# Patient Record
Sex: Male | Born: 1995 | Race: White | Hispanic: No | Marital: Single | Smoking: Never smoker
Health system: Southern US, Community
[De-identification: ages and names within clinical notes are randomized; demographics above are authoritative.]

## PROBLEM LIST (undated history)

## (undated) DIAGNOSIS — G4489 Other headache syndrome: Secondary | ICD-10-CM

## (undated) HISTORY — DX: Other headache syndrome: G44.89

## (undated) HISTORY — PX: WISDOM TOOTH EXTRACTION: SHX21

---
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
## 2015-07-07 DIAGNOSIS — K648 Other hemorrhoids: Secondary | ICD-10-CM | POA: Diagnosis not present

## 2015-07-07 DIAGNOSIS — K625 Hemorrhage of anus and rectum: Secondary | ICD-10-CM | POA: Diagnosis not present

## 2015-07-07 DIAGNOSIS — R51 Headache: Secondary | ICD-10-CM | POA: Diagnosis not present

## 2015-08-16 DIAGNOSIS — D1801 Hemangioma of skin and subcutaneous tissue: Secondary | ICD-10-CM | POA: Diagnosis not present

## 2015-08-16 DIAGNOSIS — L906 Striae atrophicae: Secondary | ICD-10-CM | POA: Diagnosis not present

## 2015-08-16 DIAGNOSIS — D224 Melanocytic nevi of scalp and neck: Secondary | ICD-10-CM | POA: Diagnosis not present

## 2015-10-06 DIAGNOSIS — D1801 Hemangioma of skin and subcutaneous tissue: Secondary | ICD-10-CM | POA: Diagnosis not present

## 2015-10-06 DIAGNOSIS — L720 Epidermal cyst: Secondary | ICD-10-CM | POA: Diagnosis not present

## 2015-10-06 DIAGNOSIS — D224 Melanocytic nevi of scalp and neck: Secondary | ICD-10-CM | POA: Diagnosis not present

## 2015-10-06 DIAGNOSIS — D2261 Melanocytic nevi of right upper limb, including shoulder: Secondary | ICD-10-CM | POA: Diagnosis not present

## 2015-10-20 DIAGNOSIS — Z139 Encounter for screening, unspecified: Secondary | ICD-10-CM | POA: Diagnosis not present

## 2015-10-20 DIAGNOSIS — K648 Other hemorrhoids: Secondary | ICD-10-CM | POA: Diagnosis not present

## 2015-10-20 DIAGNOSIS — R51 Headache: Secondary | ICD-10-CM | POA: Diagnosis not present

## 2015-10-20 DIAGNOSIS — K625 Hemorrhage of anus and rectum: Secondary | ICD-10-CM | POA: Diagnosis not present

## 2016-06-21 DIAGNOSIS — R5383 Other fatigue: Secondary | ICD-10-CM | POA: Diagnosis not present

## 2016-06-21 DIAGNOSIS — J301 Allergic rhinitis due to pollen: Secondary | ICD-10-CM | POA: Diagnosis not present

## 2016-06-26 DIAGNOSIS — R51 Headache: Secondary | ICD-10-CM | POA: Diagnosis not present

## 2016-06-26 DIAGNOSIS — D72829 Elevated white blood cell count, unspecified: Secondary | ICD-10-CM | POA: Diagnosis not present

## 2016-06-26 DIAGNOSIS — R531 Weakness: Secondary | ICD-10-CM | POA: Diagnosis not present

## 2016-07-10 DIAGNOSIS — R51 Headache: Secondary | ICD-10-CM | POA: Diagnosis not present

## 2016-07-13 DIAGNOSIS — Z0100 Encounter for examination of eyes and vision without abnormal findings: Secondary | ICD-10-CM | POA: Diagnosis not present

## 2016-07-19 ENCOUNTER — Ambulatory Visit
Admission: RE | Admit: 2016-07-19 | Discharge: 2016-07-19 | Disposition: A | Discharge status: Home / Self Care | Payer: BLUE CROSS/BLUE SHIELD | Source: Ambulatory Visit | Attending: Internal Medicine | Admitting: Internal Medicine

## 2016-07-19 ENCOUNTER — Other Ambulatory Visit: Payer: Self-pay | Admitting: Internal Medicine

## 2016-07-19 DIAGNOSIS — J029 Acute pharyngitis, unspecified: Secondary | ICD-10-CM | POA: Diagnosis not present

## 2016-07-19 DIAGNOSIS — R51 Headache: Secondary | ICD-10-CM | POA: Diagnosis not present

## 2016-07-19 DIAGNOSIS — R519 Headache, unspecified: Secondary | ICD-10-CM

## 2016-07-19 DIAGNOSIS — R05 Cough: Secondary | ICD-10-CM | POA: Diagnosis not present

## 2016-07-19 DIAGNOSIS — J322 Chronic ethmoidal sinusitis: Secondary | ICD-10-CM | POA: Diagnosis not present

## 2016-07-19 IMAGING — CT CT HEAD W/O CM
3 of 4 series · 17 of 47 positions shown, 20 images · non-contrast
Comparison: None.

CLINICAL DATA: Non intractable headache.

EXAM:
CT HEAD WITHOUT CONTRAST
TECHNIQUE: Contiguous axial images were obtained from the base of the skull
through the vertex without intravenous contrast.

[Series 32: 3d filtered head w/o · axial · non-contrast · 0.49mm/px · z∈[-13,+127]mm · 11 of 34 slices shown, 14 images]
[im 3/34  brain]
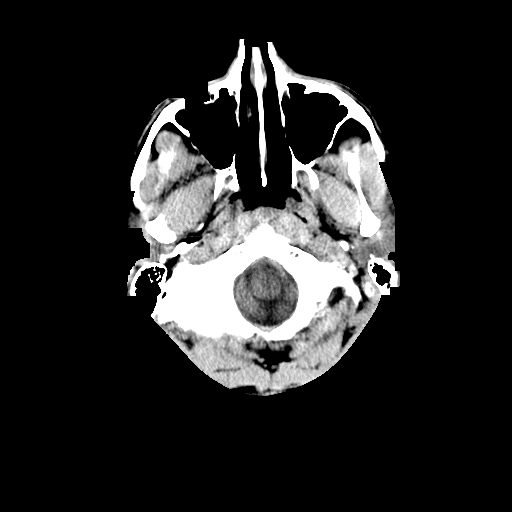
[im 3/34  bone]
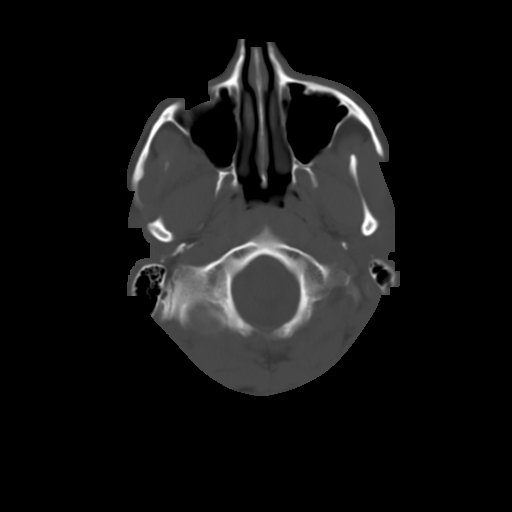
[im 5/34  brain]
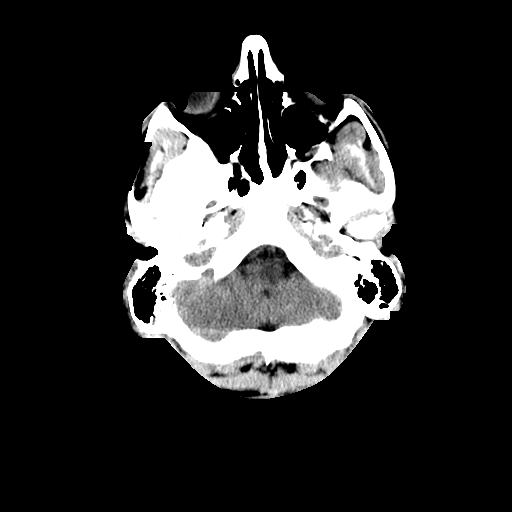
[im 8/34  brain]
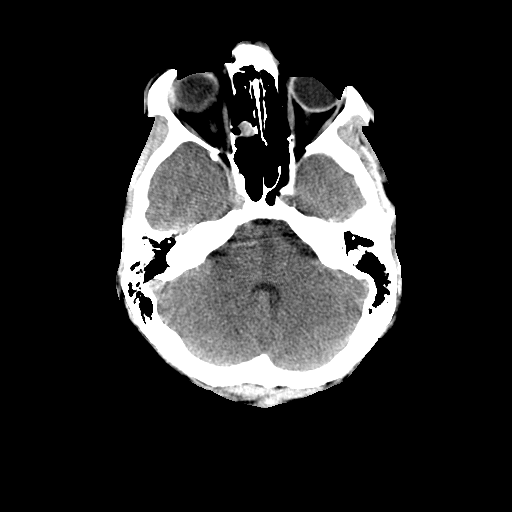
[im 12/34  brain]
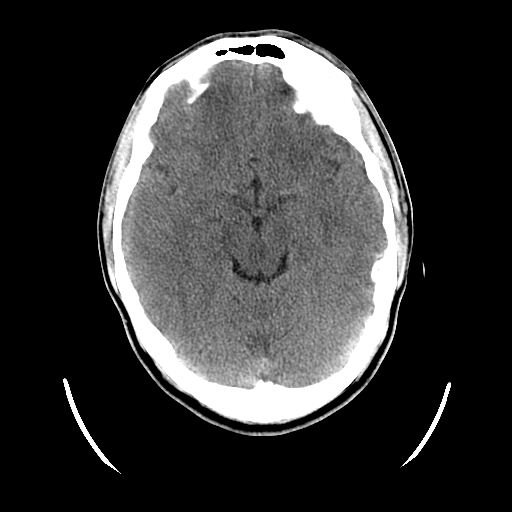
[im 15/34  brain]
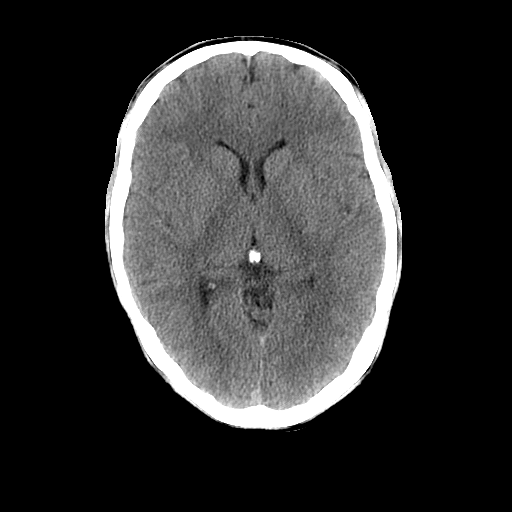
[im 15/34  bone]
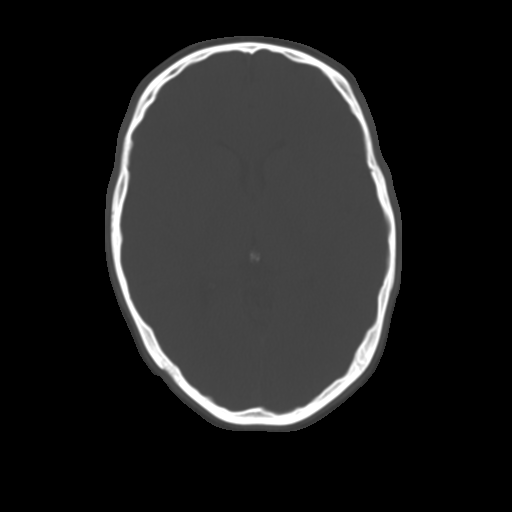
[im 17/34  brain]
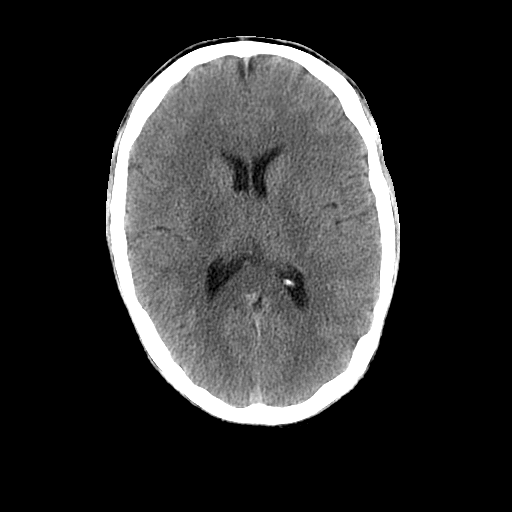
[im 19/34  brain]
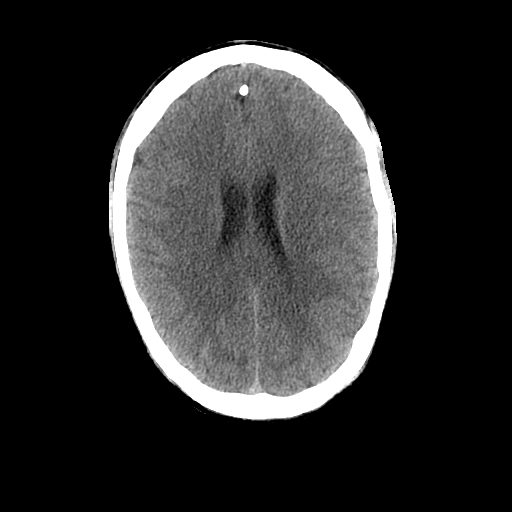
[im 22/34  brain]
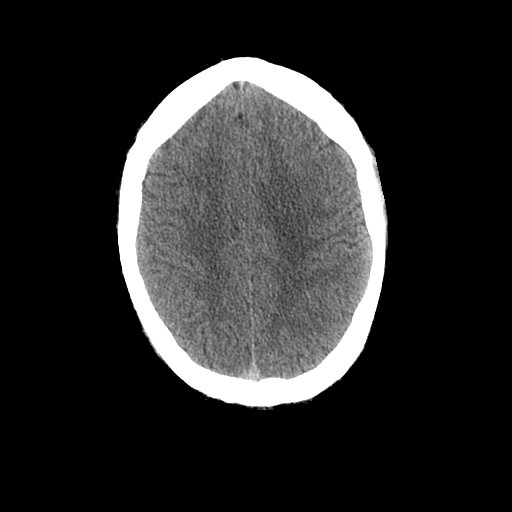
[im 26/34  brain]
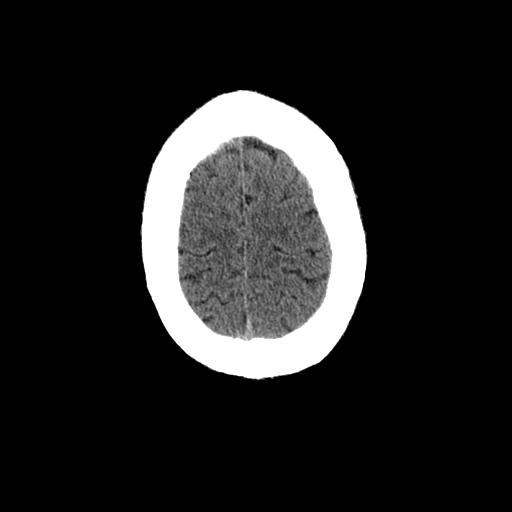
[im 26/34  bone]
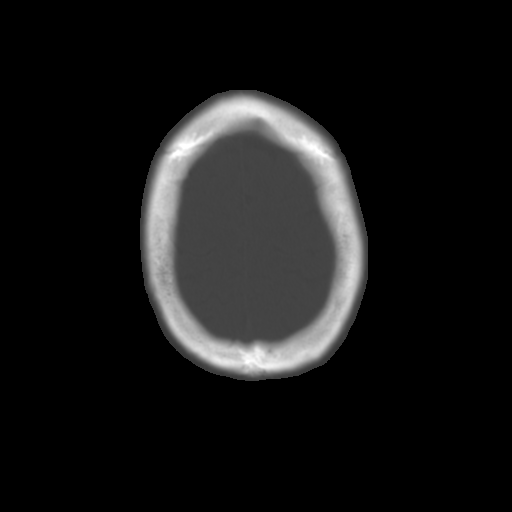
[im 29/34  brain]
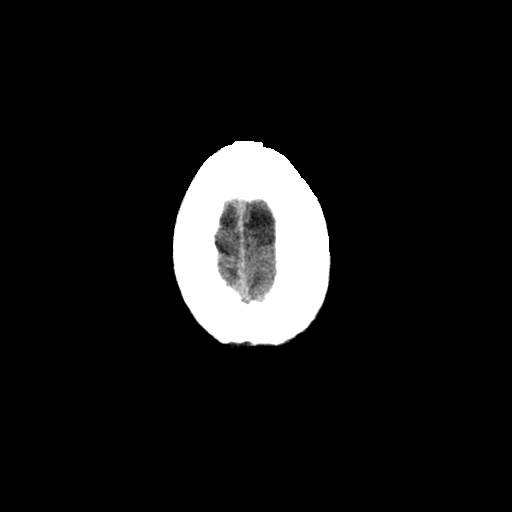
[im 31/34  brain]
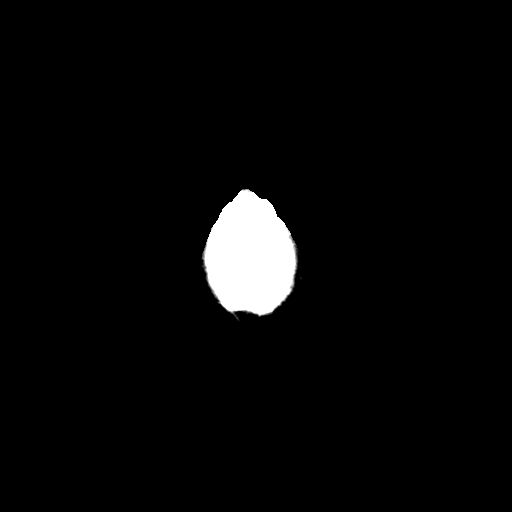

[Series 601: coronal brain · coronal · 0.49mm/px · 3 of 70 slices shown]
[im 24/70  brain]
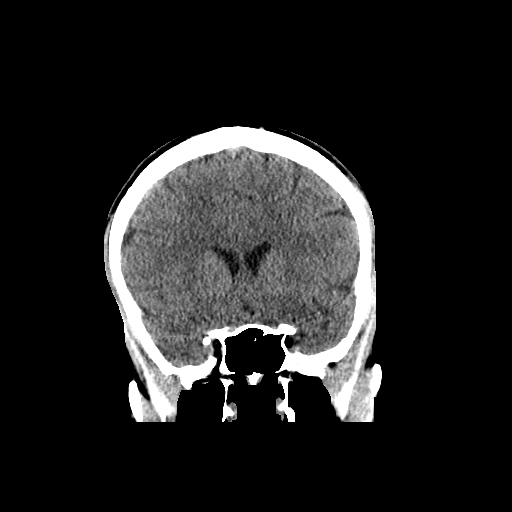
[im 31/70  brain]
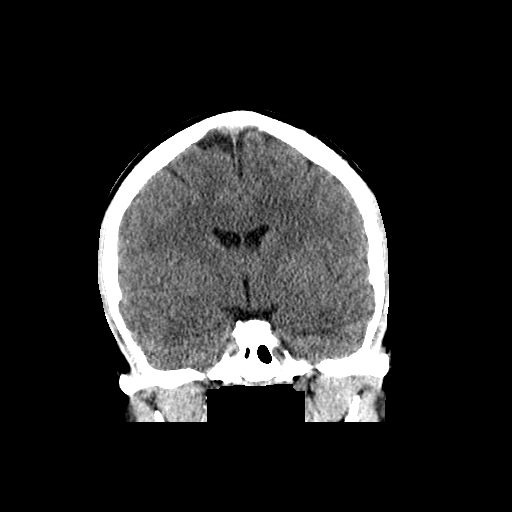
[im 39/70  brain]
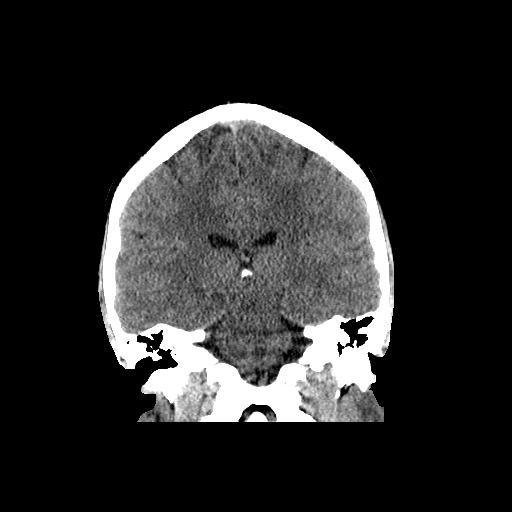

[Series 602: sagittal brain · sagittal · 0.49mm/px · 3 of 59 slices shown]
[im 20/59  brain]
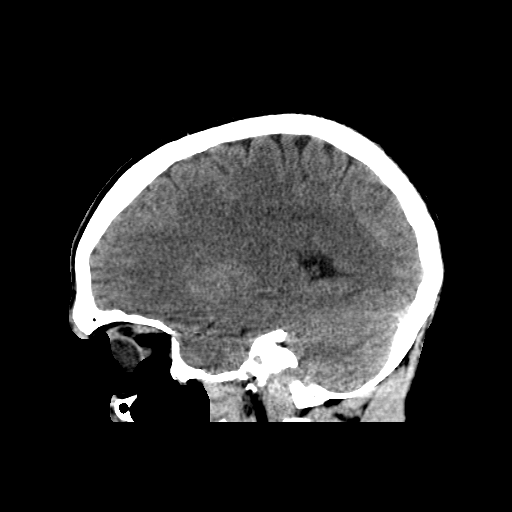
[im 30/59  brain]
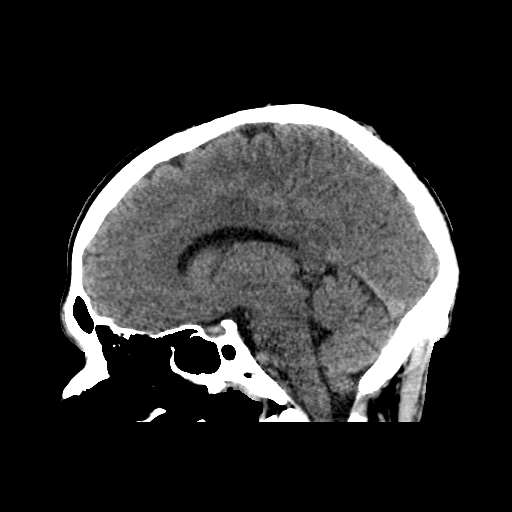
[im 39/59  brain]
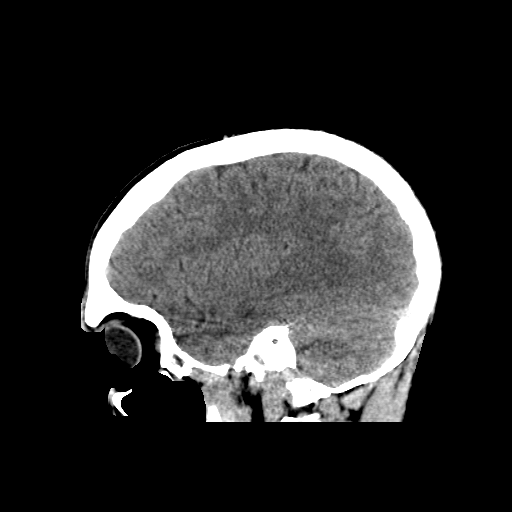

[17 of 47 positions shown; findings below may reference images not displayed]

FINDINGS: Brain: Normal. No evidence of acute infarction, hemorrhage,
hydrocephalus, extra-axial collection or mass lesion/mass effect.

Vascular: No hyperdense vessel or unexpected calcification.

Skull: No acute or destructive finding.

Sinuses/Orbits: Opacified right posterior ethmoid air cell.
IMPRESSION: 1. Normal intracranial imaging.
2. Mild sinusitis with opacified right posterior ethmoid air cell.

## 2016-08-10 ENCOUNTER — Ambulatory Visit: Payer: BLUE CROSS/BLUE SHIELD | Admitting: Neurology

## 2016-08-31 DIAGNOSIS — K210 Gastro-esophageal reflux disease with esophagitis: Secondary | ICD-10-CM | POA: Diagnosis not present

## 2016-08-31 DIAGNOSIS — R1084 Generalized abdominal pain: Secondary | ICD-10-CM | POA: Diagnosis not present

## 2016-09-04 ENCOUNTER — Ambulatory Visit (INDEPENDENT_AMBULATORY_CARE_PROVIDER_SITE_OTHER): Payer: BLUE CROSS/BLUE SHIELD | Admitting: Neurology

## 2016-09-04 ENCOUNTER — Encounter: Payer: Self-pay | Admitting: Neurology

## 2016-09-04 VITALS — BP 126/72 | HR 62 | Ht 75.0 in | Wt 188.0 lb

## 2016-09-04 DIAGNOSIS — R55 Syncope and collapse: Secondary | ICD-10-CM | POA: Diagnosis not present

## 2016-09-04 DIAGNOSIS — G4489 Other headache syndrome: Secondary | ICD-10-CM | POA: Diagnosis not present

## 2016-09-04 HISTORY — DX: Other headache syndrome: G44.89

## 2016-09-04 MED ORDER — GABAPENTIN 300 MG PO CAPS: 300.0000 mg | ORAL_CAPSULE | Freq: Two times a day (BID) | ORAL | 3 refills | Status: DC

## 2016-09-04 NOTE — Patient Instructions (Signed)
   We will get MRI of the neck and MRA of the blood circulation to the head. We will start gabapentin for the headache.  Neurontin (gabapentin) may result in drowsiness, ankle swelling, gait instability, or possibly dizziness. Please contact our office if significant side effects occur with this medication.

## 2016-09-04 NOTE — Progress Notes (Signed)
Reason for visit: Headache  Referring physician: Dr. Delrae SawyersPolite  Ross Wilson is a 21 y.o. male  History of present illness:  Ross Wilson is a 21 year old left-handed white male with a history of onset of a headache that began about 2 months prior to this evaluation. The patient indicated that he had spontaneous onset of a burning discomfort in the left neck and left occipital area, there is no projection of pain to the retro-orbital area on the left. The patient indicates that he does not have any neck stiffness whatsoever, he can turn his head without any change in the pain or discomfort. When he palpates the back of the head there is no discomfort with this. Occasionally the same pain may be on the right side. The discomfort is daily in nature, he has taken Advil or Tylenol without any benefit, he currently does not take anything for the headache. He denies any visual complaints, but he has noted frequent events of near-syncope that may occur. This began after the onset of the headache. He had what appeared to be a vasovagal syncopal event with a blood draw within the last several weeks. He may have other episodes of near-syncope and he will lie down to prevent a blackout. He denies a numbness or weakness of the extremities but he has a generalized sensation of fatigue. He did have some soreness of the throat and some difficulty with swallowing around the onset of the headache. He has undergone a CT scan of the brain that has been unremarkable. He is sent to this office for further evaluation. He has no pre-existing history of headache prior to this event. His mother does have a history of ocular migraine.  No past medical history on file.  Past Surgical History:  Procedure Laterality Date  . WISDOM TOOTH EXTRACTION     x4    History reviewed. No pertinent family history.  Social history:  reports that he has never smoked. He has never used smokeless tobacco. He reports that he drinks  alcohol. He reports that he does not use drugs.  Medications:  Prior to Admission medications   Medication Sig Start Date End Date Taking? Authorizing Provider  dexlansoprazole (DEXILANT) 60 MG capsule Take 60 mg by mouth daily.   Yes [provider]     Allergies not on file  ROS:  Out of a complete 14 system review of symptoms, the patient complains only of the following symptoms, and all other reviewed systems are negative.  Fatigue Chest pain Difficulty swallowing Cough Blood in the stool Achy muscles Headache, weakness, difficulty swallowing, passing out Anxiety, too much sleep, decreased energy, disinterest in activities  Blood pressure 126/72, pulse 62, height 6\' 3"  (1.905 m), weight 188 lb (85.3 kg).  Physical Exam  General: The patient is alert and cooperative at the time of the examination.  Eyes: Pupils are equal, round, and reactive to light. Discs are flat bilaterally.  Neck: The neck is supple, no carotid bruits are noted.  Respiratory: The respiratory examination is clear.  Cardiovascular: The cardiovascular examination reveals a regular rate and rhythm, no obvious murmurs or rubs are noted.  Neuromuscular: Range of movement of the cervical spine is full.  Skin: Extremities are without significant edema.  Neurologic Exam  Mental status: The patient is alert and oriented x 3 at the time of the examination. The patient has apparent normal recent and remote memory, with an apparently normal attention span and concentration ability.  Cranial  nerves: Facial symmetry is present. There is good sensation of the face to pinprick and soft touch bilaterally. The strength of the facial muscles and the muscles to head turning and shoulder shrug are normal bilaterally. Speech is well enunciated, no aphasia or dysarthria is noted. Extraocular movements are full. Visual fields are full. The tongue is midline, and the patient has symmetric elevation of the soft  palate. No obvious hearing deficits are noted.  Motor: The motor testing reveals 5 over 5 strength of all 4 extremities. Good symmetric motor tone is noted throughout.  Sensory: Sensory testing is intact to pinprick, soft touch, vibration sensation, and position sense on all 4 extremities. No evidence of extinction is noted.  Coordination: Cerebellar testing reveals good finger-nose-finger and heel-to-shin bilaterally.  Gait and station: Gait is normal. Tandem gait is normal. Romberg is negative. No drift is seen.  Reflexes: Deep tendon reflexes are symmetric and normal bilaterally. Toes are downgoing bilaterally.    Ct head 07/19/16:  IMPRESSION: 1. Normal intracranial imaging. 2. Mild sinusitis with opacified right posterior ethmoid air cell.  * CT scan images were reviewed online. I agree with the written report.    Assessment/Plan:  1. Left occipital headache  2. Episodic near syncope  The patient has had onset of a well localized left neck and left occipital headache unassociated with neck stiffness or tenderness to palpation of the left neck or occipital area. He has begun to have more frequent episodes of near-syncope since onset of the headache. The headache has a burning constant quality, and does not appear to be consistent with migraine. He does not have any other features of migraine such as photophobia, phonophobia, nausea or vomiting, or visual changes. The patient will be evaluated to fully exclude a left vertebral artery dissection, he will have MRI angiogram of the neck, he will have MRI of the cervical spine. The lack of neck stiffness is unusual for a true cervicogenic headache. The patient will be placed on gabapentin taking 300 mg twice daily, he will follow-up in 2 months.  Marlan Palau. Keith Willis MD 09/04/2016 3:37 PM  [facility] [address] [place], [place] [id number]  Phone [phone] Fax [phone]

## 2016-09-14 ENCOUNTER — Ambulatory Visit: Payer: BLUE CROSS/BLUE SHIELD | Admitting: Neurology

## 2016-09-20 ENCOUNTER — Ambulatory Visit (INDEPENDENT_AMBULATORY_CARE_PROVIDER_SITE_OTHER): Payer: BLUE CROSS/BLUE SHIELD

## 2016-09-20 DIAGNOSIS — G4489 Other headache syndrome: Secondary | ICD-10-CM | POA: Diagnosis not present

## 2016-09-20 DIAGNOSIS — R55 Syncope and collapse: Secondary | ICD-10-CM | POA: Diagnosis not present

## 2016-09-20 MED ORDER — GADOPENTETATE DIMEGLUMINE 469.01 MG/ML IV SOLN: 20.0000 mL | Freq: Once | INTRAVENOUS | Status: AC | PRN

## 2016-09-21 DIAGNOSIS — J029 Acute pharyngitis, unspecified: Secondary | ICD-10-CM | POA: Diagnosis not present

## 2016-09-22 ENCOUNTER — Telehealth: Payer: Self-pay | Admitting: Neurology

## 2016-09-22 NOTE — Telephone Encounter (Signed)
I called the patient. The MRI the cervical spine and MRA of the neck was normal. We will continue on with gabapentin, he is not getting much benefit from 300 mg twice daily, he will go to 300 mg in the morning and 600 mg in the evening, he will call in one week if he is tolerating the dose and we will go up higher.   MRA neck 09/22/16:  IMPRESSION:  Normal MRA neck (with and without). Specifically, no evidence of left vertebral artery dissection.    MRI cervical 09/22/16:  IMPRESSION:  Normal MRI cervical spine (without).

## 2016-09-22 NOTE — Telephone Encounter (Signed)
    MRI cervical 09/22/16:  IMPRESSION:  Normal MRI cervical spine (without).

## 2016-09-25 ENCOUNTER — Other Ambulatory Visit: Payer: Self-pay | Admitting: Gastroenterology

## 2016-09-25 DIAGNOSIS — R1084 Generalized abdominal pain: Secondary | ICD-10-CM

## 2016-09-26 ENCOUNTER — Ambulatory Visit
Admission: RE | Admit: 2016-09-26 | Discharge: 2016-09-26 | Disposition: A | Discharge status: Home / Self Care | Payer: BLUE CROSS/BLUE SHIELD | Source: Ambulatory Visit | Attending: Gastroenterology | Admitting: Gastroenterology

## 2016-09-26 DIAGNOSIS — R1084 Generalized abdominal pain: Secondary | ICD-10-CM

## 2016-09-26 DIAGNOSIS — K921 Melena: Secondary | ICD-10-CM | POA: Diagnosis not present

## 2016-09-26 DIAGNOSIS — R1013 Epigastric pain: Secondary | ICD-10-CM | POA: Diagnosis not present

## 2016-09-26 DIAGNOSIS — K625 Hemorrhage of anus and rectum: Secondary | ICD-10-CM | POA: Diagnosis not present

## 2016-09-26 IMAGING — CT CT ABD-PELV W/ CM
1 of 2 series · 15 of 32 positions shown, 19 images · IV contrast (APPLIED)
Comparison: None.

CLINICAL DATA: Epigastric pain and severe fatigue for 2 months.
Blood in stool.

EXAM:
CT ABDOMEN AND PELVIS WITH CONTRAST
TECHNIQUE: Multidetector CT imaging of the abdomen and pelvis was performed
using the standard protocol following bolus administration of
intravenous contrast.
CONTRAST:  100mL [id number] IOPAMIDOL ([id number]) INJECTION 61%

[Series 2: abd/pelvis w/cm · axial · 0.63mm/px · z∈[-567,-87]mm · 15 of 106 slices shown, 19 images]
[im 5/106  soft-tissue]
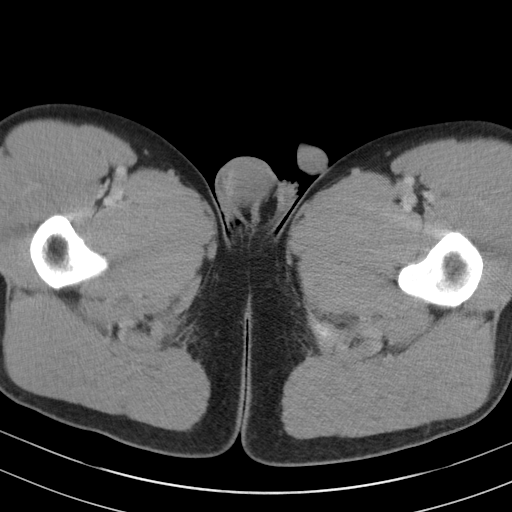
[im 5/106  bone]
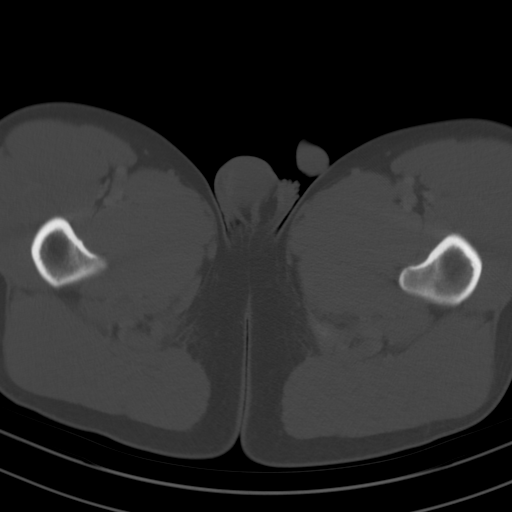
[im 13/106  soft-tissue]
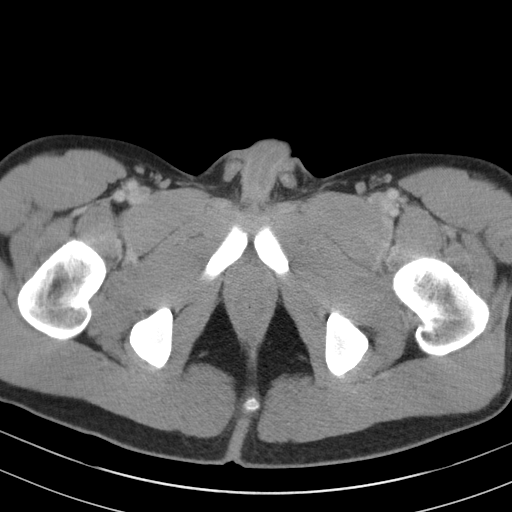
[im 22/106  soft-tissue]
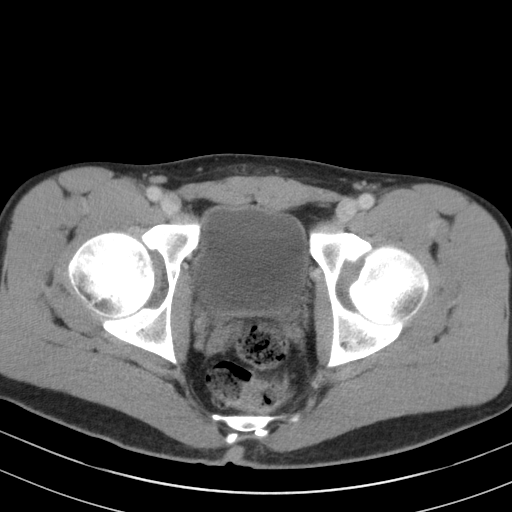
[im 30/106  soft-tissue]
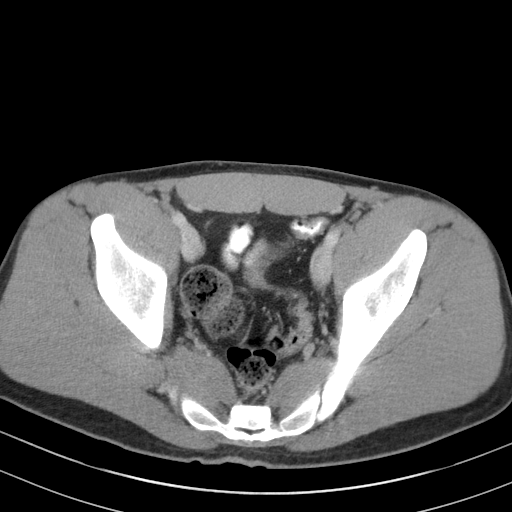
[im 38/106  soft-tissue]
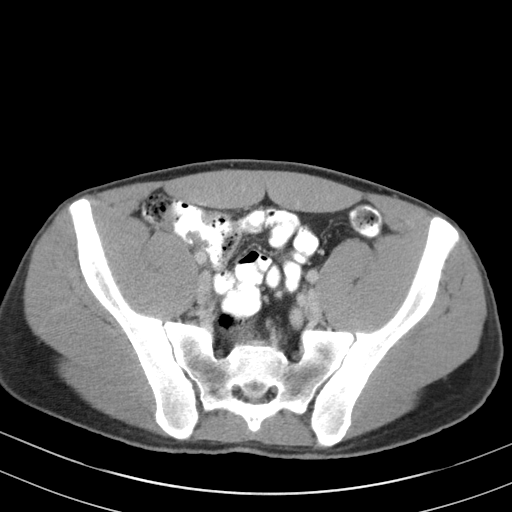
[im 47/106  soft-tissue]
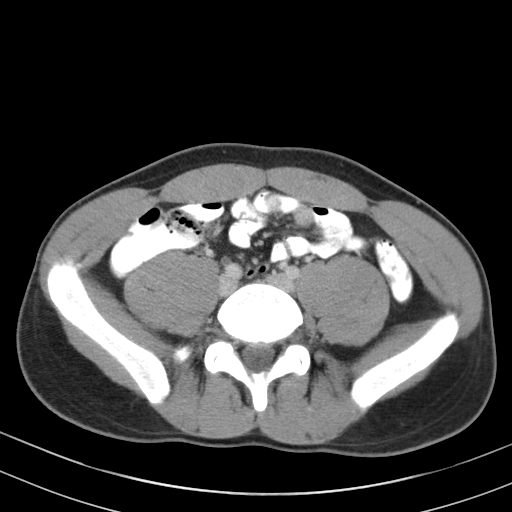
[im 55/106  soft-tissue]
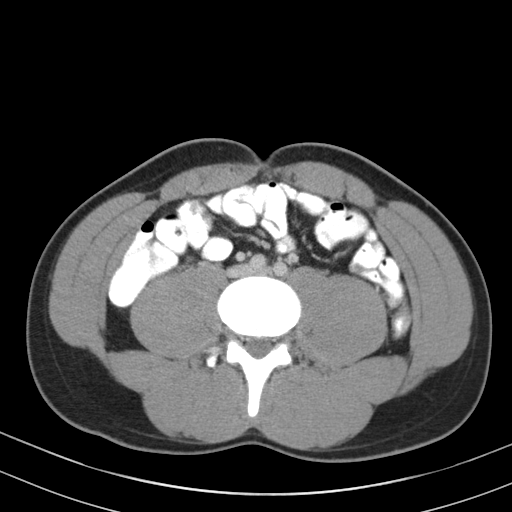
[im 59/106  soft-tissue]
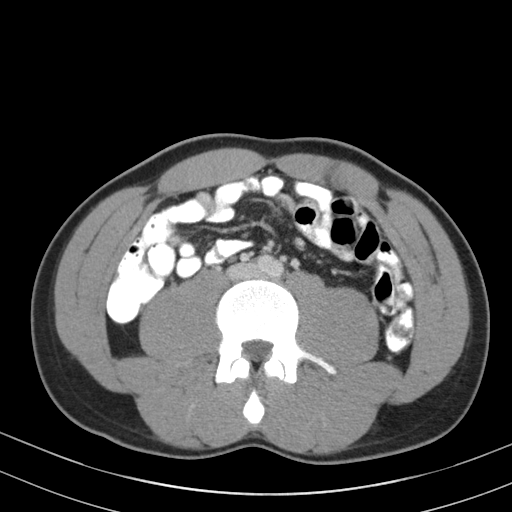
[im 68/106  soft-tissue]
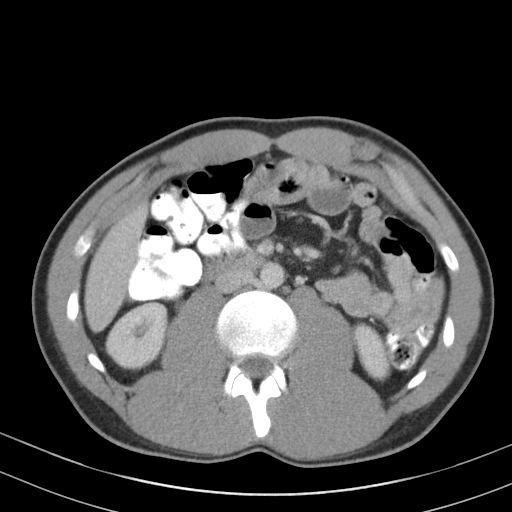
[im 68/106  bone]
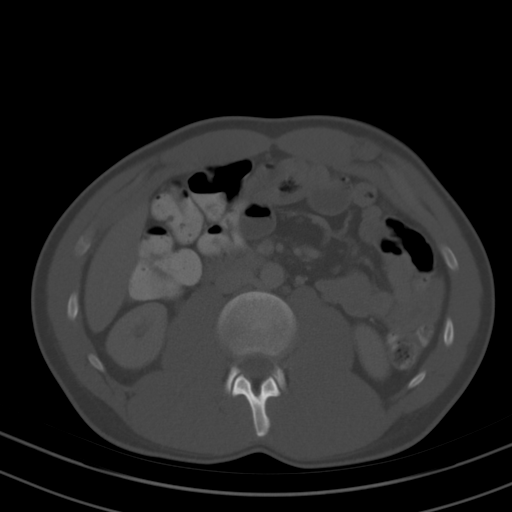
[im 76/106  soft-tissue]
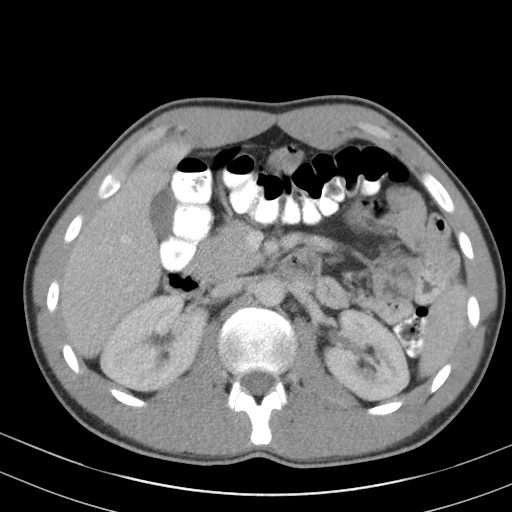
[im 85/106  soft-tissue]
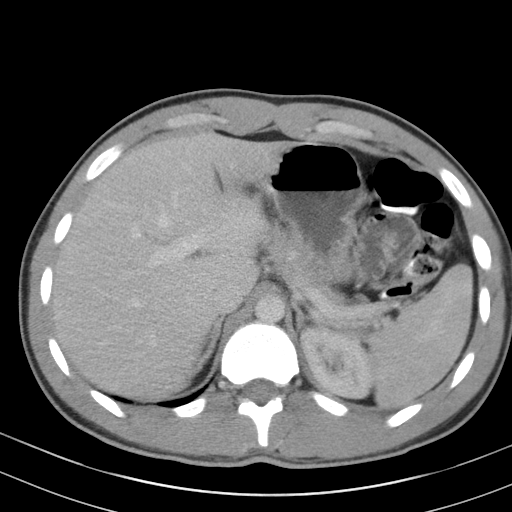
[im 89/106  lung]
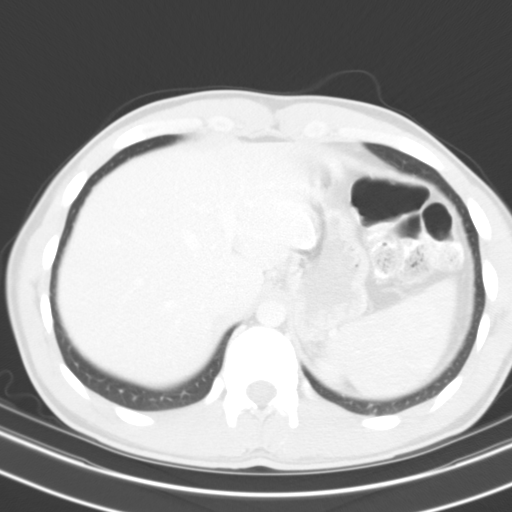
[im 93/106  soft-tissue]
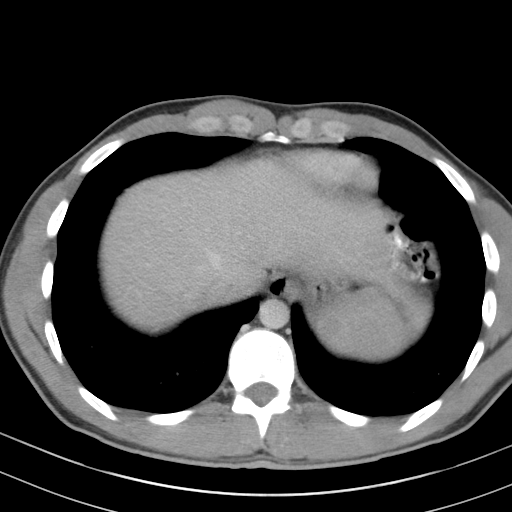
[im 93/106  lung]
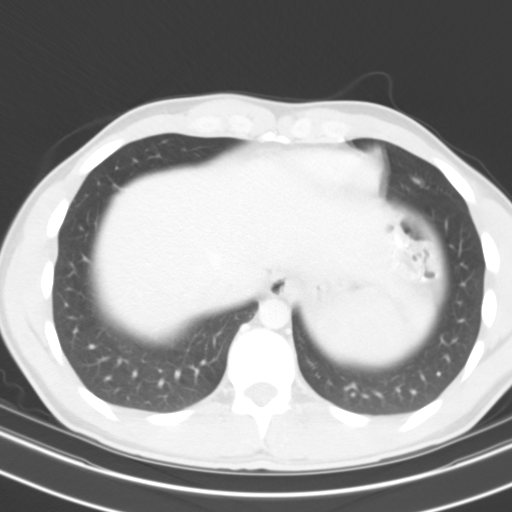
[im 97/106  lung]
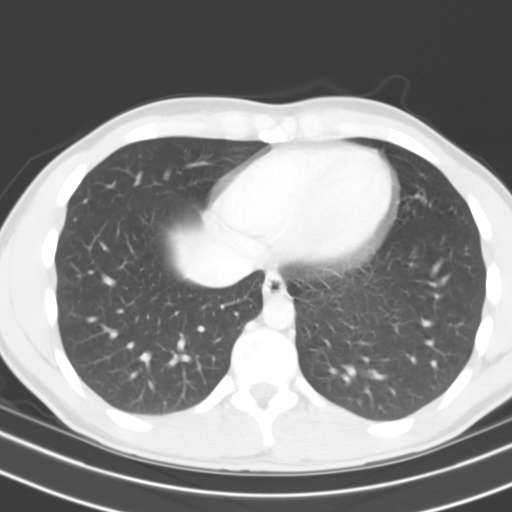
[im 101/106  soft-tissue]
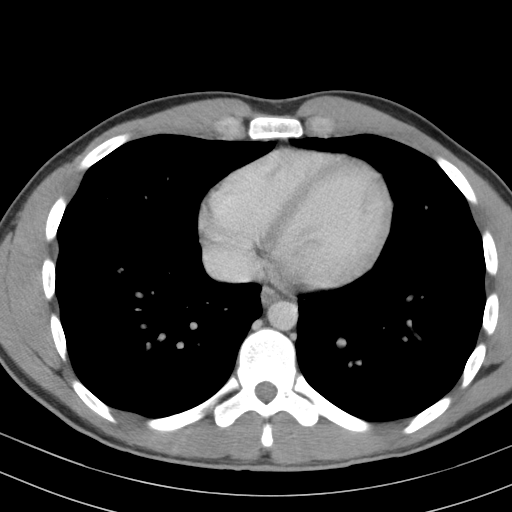
[im 101/106  lung]
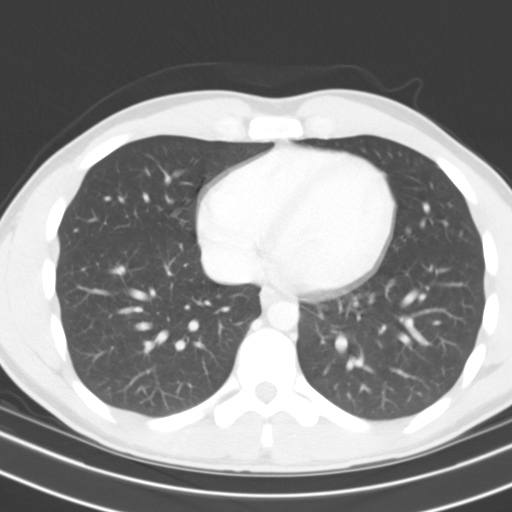

[15 of 32 positions shown; findings below may reference images not displayed]

FINDINGS: Lower Chest: No acute findings.

Hepatobiliary: No hepatic masses identified. Gallbladder is
unremarkable.

Pancreas:  No mass or inflammatory changes.

Spleen: Within normal limits in size and appearance.

Adrenals/Urinary Tract: No masses identified. No evidence of
hydronephrosis.

Stomach/Bowel: No evidence of obstruction, inflammatory process or
abnormal fluid collections. Normal appendix visualized.

Vascular/Lymphatic: No pathologically enlarged lymph nodes. No
abdominal aortic aneurysm.

Reproductive:  No mass or other significant abnormality.

Other:  None.

Musculoskeletal:  No suspicious bone lesions identified.
IMPRESSION: Negative.  No acute findings or other significant abnormality.

## 2016-09-26 MED ORDER — IOPAMIDOL (ISOVUE-300) INJECTION 61%
100.0000 mL | Freq: Once | INTRAVENOUS | Status: AC | PRN
Start: 2016-09-26 — End: 2016-09-26
  Administered 2016-09-26: 100 mL via INTRAVENOUS

## 2016-09-27 DIAGNOSIS — K921 Melena: Secondary | ICD-10-CM | POA: Diagnosis not present

## 2016-10-02 DIAGNOSIS — B078 Other viral warts: Secondary | ICD-10-CM | POA: Diagnosis not present

## 2016-10-02 DIAGNOSIS — D2261 Melanocytic nevi of right upper limb, including shoulder: Secondary | ICD-10-CM | POA: Diagnosis not present

## 2016-10-02 DIAGNOSIS — D1801 Hemangioma of skin and subcutaneous tissue: Secondary | ICD-10-CM | POA: Diagnosis not present

## 2016-10-02 DIAGNOSIS — D225 Melanocytic nevi of trunk: Secondary | ICD-10-CM | POA: Diagnosis not present

## 2016-10-02 MED ORDER — CYCLOBENZAPRINE HCL 10 MG PO TABS: 10.0000 mg | ORAL_TABLET | Freq: Three times a day (TID) | ORAL | 6 refills | Status: DC | PRN

## 2016-10-02 NOTE — Telephone Encounter (Signed)
Have called in some Flexeril 10 mg 3 times a day as needed for him

## 2016-10-02 NOTE — Telephone Encounter (Signed)
Patient would like to discuss his MRI results.He did get results from Dr. Anne Hahn but needs to discuss again.

## 2016-10-02 NOTE — Telephone Encounter (Signed)
Returned call to patient.  He did not have any questions concerning his normal MRI.  He is taking gabapentin 300mg  in am and 600mg  in pm for pain.  He is requesting a muscle relaxer to help with his neck stiffness.  He has no medication allergies.

## 2016-10-02 NOTE — Addendum Note (Signed)
Addended by: Levert Feinstein on: 10/02/2016 05:26 PM   Modules accepted: Orders

## 2016-10-02 NOTE — Telephone Encounter (Signed)
Returned call to patient - he is agreeable to try cyclobenzaprine.  Suggested he try it for the first time in the evening when he is home from work and to be aware of the potential drowsiness the medication can cause.  Also, he can try applying a heating pad and light stretches for benefit of his stiffness.

## 2016-10-03 DIAGNOSIS — K921 Melena: Secondary | ICD-10-CM | POA: Diagnosis not present

## 2016-10-03 DIAGNOSIS — R1084 Generalized abdominal pain: Secondary | ICD-10-CM | POA: Diagnosis not present

## 2016-10-10 ENCOUNTER — Ambulatory Visit: Payer: BLUE CROSS/BLUE SHIELD | Admitting: Neurology

## 2016-10-11 DIAGNOSIS — R1013 Epigastric pain: Secondary | ICD-10-CM | POA: Diagnosis not present

## 2016-10-11 DIAGNOSIS — R195 Other fecal abnormalities: Secondary | ICD-10-CM | POA: Diagnosis not present

## 2016-10-11 DIAGNOSIS — K2930 Chronic superficial gastritis without bleeding: Secondary | ICD-10-CM | POA: Diagnosis not present

## 2016-10-16 DIAGNOSIS — K2930 Chronic superficial gastritis without bleeding: Secondary | ICD-10-CM | POA: Diagnosis not present

## 2016-11-03 NOTE — Progress Notes (Signed)
[facility]  PATIENT: Ross Wilson DOB: 17-Mar-1995   REASON FOR VISIT: Follow-up for headache, neck pain HISTORY FROM: Patient    HISTORY OF PRESENT ILLNESS:UPDATE 10/01/2018CM Mr. Ross Wilson, 21 year old male returns for follow-up with a history of headache and burning discomfort in the left neck and left occipital area. He also had some near syncopal episodes after his headaches started however he denies any since last seen. He was placed on gabapentin and later Flexeril but he is not taking either of these medications presently. He feels like his symptoms have almost resolved. MRA of the neck on 09/20/2016 was normal without evidence of left vertebral artery dissection. MRI of cervical spine was normal. He denies any visual complaints he denies any numbness or weakness of the extremities. He returns for reevaluation    09/04/16 KWMr. Ross Wilson is a 21 year old left-handed white male with a history of onset of a headache that began about 2 months prior to this evaluation. The patient indicated that he had spontaneous onset of a burning discomfort in the left neck and left occipital area, there is no projection of pain to the retro-orbital area on the left. The patient indicates that he does not have any neck stiffness whatsoever, he can turn his head without any change in the pain or discomfort. When he palpates the back of the head there is no discomfort with this. Occasionally the same pain may be on the right side. The discomfort is daily in nature, he has taken Advil or Tylenol without any benefit, he currently does not take anything for the headache. He denies any visual complaints, but he has noted frequent events of near-syncope that may occur. This began after the onset of the headache. He had what appeared to be a vasovagal syncopal event with a blood draw within the last several weeks. He may have other episodes of near-syncope and he will lie down to prevent a blackout. He  denies a numbness or weakness of the extremities but he has a generalized sensation of fatigue. He did have some soreness of the throat and some difficulty with swallowing around the onset of the headache. He has undergone a CT scan of the brain that has been unremarkable. He is sent to this office for further evaluation. He has no pre-existing history of headache prior to this event. His mother does have a history of ocular migraine.   REVIEW OF SYSTEMS: Full 14 system review of systems performed and notable only for those listed, all others are neg:  Constitutional: neg  Cardiovascular: neg Ear/Nose/Throat: neg  Skin: neg Eyes: neg Respiratory: neg Gastroitestinal: neg  Hematology/Lymphatic: neg  Endocrine: neg Musculoskeletal:neg Allergy/Immunology: neg Neurological: neg Psychiatric: neg Sleep : neg   ALLERGIES: Not on File  HOME MEDICATIONS: Outpatient Medications Prior to Visit  Medication Sig Dispense Refill  . cyclobenzaprine (FLEXERIL) 10 MG tablet Take 1 tablet (10 mg total) by mouth 3 (three) times daily as needed for muscle spasms. (Patient not taking: Reported on 11/06/2016) 60 tablet 6  . dexlansoprazole (DEXILANT) 60 MG capsule Take 60 mg by mouth daily.    Marland Kitchen gabapentin (NEURONTIN) 300 MG capsule Take 1 capsule (300 mg total) by mouth 2 (two) times daily. (Patient not taking: Reported on 11/06/2016) 60 capsule 3   Facility-Administered Medications Prior to Visit  Medication Dose Route Frequency Provider Last Rate Last Dose  . gadopentetate dimeglumine (MAGNEVIST) injection 20 mL  20 mL Intravenous Once PRN York Spaniel, MD  PAST MEDICAL HISTORY: Past Medical History:  Diagnosis Date  . 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 WISDOM TOOTH EXTRACTION     x4    FAMILY HISTORY: History reviewed. No pertinent family history.  SOCIAL HISTORY: Social History   Social History  .  Marital status: Single    Spouse name: N/A  . Number of children: 0  . Years of education: College   Occupational History  . Traveling sales    Social History Main Topics  . Smoking status: Never Smoker  . Smokeless tobacco: Never Used  . Alcohol use Yes     Comment: Couple drinks per weekend  . Drug use: No  . Sexual activity: Not on file   Other Topics Concern  . Not on file   Social History Narrative   Lives with parents   Caffeine use: Soda sometimes   Left handed     PHYSICAL EXAM  Vitals:   11/06/16 0907  BP: 121/65  Pulse: (!) 56  Weight: 189 lb 3.2 oz (85.8 kg)  Height:  (1.905 m)   Body mass index is 23.65 kg/m.  Generalized: Well developed, in no acute distress  Head: normocephalic and atraumatic,. Oropharynx benign  Neck: Supple, no carotid bruits  Cardiac: Regular rate rhythm, no murmur  Musculoskeletal: No deformity   Neurological examination   Mentation: Alert oriented to time, place, history taking. Attention span and concentration appropriate. Recent and remote memory intact.  Follows all commands speech and language fluent.   Cranial nerve II-XII: Fundoscopic exam reveals sharp disc margins.Pupils were equal round reactive to light extraocular movements were full, visual field were full on confrontational test. Facial sensation and strength were normal. hearing was intact to finger rubbing bilaterally. Uvula tongue midline. head turning and shoulder shrug were normal and symmetric.Tongue protrusion into cheek strength was normal. Motor: normal bulk and tone, full strength in the BUE, BLE, fine finger movements normal, no pronator drift. No focal weakness Sensory: normal and symmetric to light touch, pinprick, and  Vibration, proprioception  Coordination: finger-nose-finger, heel-to-shin bilaterally, no dysmetria Reflexes: Brachioradialis 2/2, biceps 2/2, triceps 2/2, patellar 2/2, Achilles 2/2, plantar responses were flexor bilaterally. Gait  and Station: Rising up from seated position without assistance, normal stance,  moderate stride, good arm swing, smooth turning, able to perform tiptoe, and heel walking without difficulty. Tandem gait is steady  DIAGNOSTIC DATA (LABS, IMAGING, TESTING) - I reviewed patient records, labs, notes, testing and imaging myself where available.  ASSESSMENT AND PLAN  21 y.o. year old male  has a past medical history of Headache syndrome (09/04/2016). here to follow-up for left occipital headache/neck pain and near syncopal episodes.  PLAN: Patient has currently stopped gabapentin and Flexeril No further syncopal episodes Neck pain and headaches  much better Given copy of MRA of the neck , reviewed MRI of the neck Follow-up when necessary Portions of the above document were  copied from prior visit for review purposes only Patient made aware to call recurrent symptoms or syncopal episodes Nilda Riggs, [facility], [facility], APRN  [facility] [address] [place], [place] [address] [phone]

## 2016-11-06 ENCOUNTER — Ambulatory Visit (INDEPENDENT_AMBULATORY_CARE_PROVIDER_SITE_OTHER): Payer: BLUE CROSS/BLUE SHIELD | Admitting: Nurse Practitioner

## 2016-11-06 ENCOUNTER — Encounter: Payer: Self-pay | Admitting: Nurse Practitioner

## 2016-11-06 VITALS — BP 121/65 | HR 56 | Ht 75.0 in | Wt 189.2 lb

## 2016-11-06 DIAGNOSIS — G4489 Other headache syndrome: Secondary | ICD-10-CM | POA: Diagnosis not present

## 2016-11-06 NOTE — Progress Notes (Signed)
I have read the note, and I agree with the clinical assessment and plan.  Talita Recht KEITH   

## 2016-11-06 NOTE — Patient Instructions (Addendum)
Patient has currently stopped gabapentin and Flexeril No further syncopal episodes Neck pain is much better Given copy of MRA of the neck reviewed MRI of the neck Follow-up when necessary

## 2016-12-12 DIAGNOSIS — K921 Melena: Secondary | ICD-10-CM | POA: Diagnosis not present

## 2016-12-12 DIAGNOSIS — R1084 Generalized abdominal pain: Secondary | ICD-10-CM | POA: Diagnosis not present

## 2017-02-19 DIAGNOSIS — R1084 Generalized abdominal pain: Secondary | ICD-10-CM | POA: Diagnosis not present

## 2017-02-20 DIAGNOSIS — R5383 Other fatigue: Secondary | ICD-10-CM | POA: Diagnosis not present

## 2017-02-20 DIAGNOSIS — J029 Acute pharyngitis, unspecified: Secondary | ICD-10-CM | POA: Diagnosis not present

## 2017-02-27 DIAGNOSIS — R070 Pain in throat: Secondary | ICD-10-CM | POA: Diagnosis not present

## 2017-03-19 DIAGNOSIS — M25511 Pain in right shoulder: Secondary | ICD-10-CM | POA: Diagnosis not present

## 2017-03-19 DIAGNOSIS — M7541 Impingement syndrome of right shoulder: Secondary | ICD-10-CM | POA: Diagnosis not present

## 2017-04-06 DIAGNOSIS — J309 Allergic rhinitis, unspecified: Secondary | ICD-10-CM | POA: Diagnosis not present

## 2017-04-06 DIAGNOSIS — Z1389 Encounter for screening for other disorder: Secondary | ICD-10-CM | POA: Diagnosis not present

## 2017-04-06 DIAGNOSIS — R0789 Other chest pain: Secondary | ICD-10-CM | POA: Diagnosis not present

## 2017-04-27 DIAGNOSIS — K219 Gastro-esophageal reflux disease without esophagitis: Secondary | ICD-10-CM | POA: Diagnosis not present

## 2017-04-27 DIAGNOSIS — R070 Pain in throat: Secondary | ICD-10-CM | POA: Diagnosis not present

## 2017-06-25 DIAGNOSIS — R252 Cramp and spasm: Secondary | ICD-10-CM | POA: Diagnosis not present

## 2017-07-09 DIAGNOSIS — R252 Cramp and spasm: Secondary | ICD-10-CM | POA: Diagnosis not present

## 2017-11-08 DIAGNOSIS — R1084 Generalized abdominal pain: Secondary | ICD-10-CM | POA: Diagnosis not present

## 2017-11-08 DIAGNOSIS — K529 Noninfective gastroenteritis and colitis, unspecified: Secondary | ICD-10-CM | POA: Diagnosis not present

## 2017-11-12 DIAGNOSIS — D224 Melanocytic nevi of scalp and neck: Secondary | ICD-10-CM | POA: Diagnosis not present

## 2017-11-12 DIAGNOSIS — D225 Melanocytic nevi of trunk: Secondary | ICD-10-CM | POA: Diagnosis not present

## 2017-11-12 DIAGNOSIS — D2272 Melanocytic nevi of left lower limb, including hip: Secondary | ICD-10-CM | POA: Diagnosis not present

## 2017-11-12 DIAGNOSIS — L723 Sebaceous cyst: Secondary | ICD-10-CM | POA: Diagnosis not present

## 2018-03-28 DIAGNOSIS — R197 Diarrhea, unspecified: Secondary | ICD-10-CM | POA: Diagnosis not present

## 2018-03-28 DIAGNOSIS — R1084 Generalized abdominal pain: Secondary | ICD-10-CM | POA: Diagnosis not present

## 2018-04-01 DIAGNOSIS — D225 Melanocytic nevi of trunk: Secondary | ICD-10-CM | POA: Diagnosis not present

## 2018-04-01 DIAGNOSIS — L723 Sebaceous cyst: Secondary | ICD-10-CM | POA: Diagnosis not present

## 2018-04-01 DIAGNOSIS — A630 Anogenital (venereal) warts: Secondary | ICD-10-CM | POA: Diagnosis not present

## 2018-04-01 DIAGNOSIS — L812 Freckles: Secondary | ICD-10-CM | POA: Diagnosis not present

## 2018-04-02 DIAGNOSIS — R197 Diarrhea, unspecified: Secondary | ICD-10-CM | POA: Diagnosis not present

## 2018-04-02 DIAGNOSIS — R1084 Generalized abdominal pain: Secondary | ICD-10-CM | POA: Diagnosis not present

## 2018-04-23 DIAGNOSIS — A630 Anogenital (venereal) warts: Secondary | ICD-10-CM | POA: Diagnosis not present

## 2018-08-17 DIAGNOSIS — Z20828 Contact with and (suspected) exposure to other viral communicable diseases: Secondary | ICD-10-CM | POA: Diagnosis not present

## 2018-08-17 DIAGNOSIS — J029 Acute pharyngitis, unspecified: Secondary | ICD-10-CM | POA: Diagnosis not present

## 2018-08-17 DIAGNOSIS — Z03818 Encounter for observation for suspected exposure to other biological agents ruled out: Secondary | ICD-10-CM | POA: Diagnosis not present

## 2019-06-12 DIAGNOSIS — Z03818 Encounter for observation for suspected exposure to other biological agents ruled out: Secondary | ICD-10-CM | POA: Diagnosis not present

## 2019-06-12 DIAGNOSIS — Z20828 Contact with and (suspected) exposure to other viral communicable diseases: Secondary | ICD-10-CM | POA: Diagnosis not present
